# Patient Record
Sex: Male | Born: 2000 | Race: White | Hispanic: No | Marital: Single | Smoking: Never smoker
Health system: Southern US, Community
[De-identification: ages and names within clinical notes are randomized; demographics above are authoritative.]

## PROBLEM LIST (undated history)

## (undated) ENCOUNTER — Ambulatory Visit: Payer: Self-pay

## (undated) DIAGNOSIS — L0591 Pilonidal cyst without abscess: Secondary | ICD-10-CM

## (undated) DIAGNOSIS — K602 Anal fissure, unspecified: Secondary | ICD-10-CM

---
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
## 2022-10-29 ENCOUNTER — Ambulatory Visit
Admission: EM | Admit: 2022-10-29 | Discharge: 2022-10-29 | Disposition: A | Discharge status: Home / Self Care | Payer: Medicaid Other | Attending: Internal Medicine | Admitting: Internal Medicine

## 2022-10-29 ENCOUNTER — Other Ambulatory Visit: Payer: Self-pay

## 2022-10-29 ENCOUNTER — Ambulatory Visit
Admission: RE | Admit: 2022-10-29 | Discharge: 2022-10-29 | Disposition: A | Discharge status: Home / Self Care | Payer: Self-pay | Source: Ambulatory Visit | Attending: Internal Medicine | Admitting: Internal Medicine

## 2022-10-29 ENCOUNTER — Encounter: Payer: Self-pay | Admitting: Emergency Medicine

## 2022-10-29 DIAGNOSIS — R053 Chronic cough: Secondary | ICD-10-CM

## 2022-10-29 DIAGNOSIS — J069 Acute upper respiratory infection, unspecified: Secondary | ICD-10-CM

## 2022-10-29 MED ORDER — ALBUTEROL SULFATE HFA 108 (90 BASE) MCG/ACT IN AERS: 1.0000 | INHALATION_SPRAY | Freq: Four times a day (QID) | RESPIRATORY_TRACT | 0 refills | Status: AC | PRN

## 2022-10-29 MED ORDER — PREDNISONE 20 MG PO TABS: 40.0000 mg | ORAL_TABLET | Freq: Every day | ORAL | 0 refills | Status: AC

## 2022-10-29 MED ORDER — AMOXICILLIN-POT CLAVULANATE 875-125 MG PO TABS: 1.0000 | ORAL_TABLET | Freq: Two times a day (BID) | ORAL | 0 refills | Status: DC

## 2022-10-29 NOTE — Discharge Instructions (Addendum)
I have prescribed an antibiotic, steroid, inhaler to help alleviate your symptoms.  I have ordered an x-ray that will be completed at [facility].  Please go there today to have x-ray completed.  I will call if results are abnormal.  Follow-up with family doctor if symptoms persist or worsen.

## 2022-10-29 NOTE — ED Triage Notes (Signed)
Pt here for cough and congestion still present after being treated for PNA recently

## 2022-10-29 NOTE — ED Provider Notes (Addendum)
[facility]    CSN: [id number] Arrival date & time: 10/29/22  1413      History   Chief Complaint Chief Complaint  Patient presents with   Cough    HPI Paul [name] is a 22 y.o. male.   Patient presents with cough and nasal congestion that has been present for 1 to 2 months.  Patient was seen in a different urgent care on 09/21/2022 and treated for pneumonia with amoxicillin, azithromycin, cough medication.  Patient reports that he completed these medications fully with minimal improvement in symptoms.  No chest x-ray was completed but they treated based on symptoms.  Patient reports that he has also taken Mucinex with minimal improvement.  Cough is dry.  He does have some intermittent shortness of breath that mainly occurs with exertion.  Denies any fever.  Denies chest pain.  Denies history of asthma.  Patient does not smoke cigarettes. Mother is also present who helps provide history.    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   Past Surgical History:  Procedure Laterality Date   ORIF RADIAL FRACTURE Left 09/22/2020   Procedure: OPEN REDUCTION INTERNAL FIXATION (ORIF) LEFT  RADIAL FRACTURE;  Surgeon: Dayna Barker, MD;  Location: [place];  Service: Plastics;  Laterality: Left;  regional block   PILONIDAL CYST EXCISION N/A 07/09/2017   Procedure: EXCISION PILONIDAL CYST PEDIATRIC EXTENSIVE;  Surgeon: Stanford Scotland, MD;  Location: [place];  Service: Pediatrics;  Laterality: N/A;       Home Medications    Prior to Admission medications   Medication Sig Start Date End Date Taking? Authorizing Provider  albuterol (VENTOLIN HFA) 108 (90 Base) MCG/ACT inhaler Inhale 1-2 puffs into the lungs every 6 (six) hours as needed for wheezing or shortness of breath. 10/29/22  Yes Trynity Skousen, Hildred Alamin E, FNP  amoxicillin-clavulanate (AUGMENTIN)  875-125 MG tablet Take 1 tablet by mouth every 12 (twelve) hours. 10/29/22  Yes Amadi Frady, Hildred Alamin E, FNP  predniSONE (DELTASONE) 20 MG tablet Take 2 tablets (40 mg total) by mouth daily for 5 days. 10/29/22 11/03/22 Yes Garhett Bernhard, Michele Rockers, FNP  naproxen (NAPROSYN) 375 MG tablet Take 1 tablet (375 mg total) by mouth 2 (two) times daily. 10/18/20   Wieters, Hallie C, PA-C  ondansetron (ZOFRAN ODT) 4 MG disintegrating tablet Take 1 tablet (4 mg total) by mouth every 8 (eight) hours as needed for nausea or vomiting. 10/18/20   Joneen Caraway, Elesa Hacker, PA-C    Family History Family History  Problem Relation Age of Onset   Cancer Paternal Aunt        Thyroid   Cancer Paternal Grandmother        Bladder   Hearing loss Paternal Grandmother    Depression Maternal Grandmother    Varicose Veins Maternal Grandmother    Hearing loss Maternal Grandfather    Hyperlipidemia Maternal Grandfather    Hypertension Maternal Grandfather    Hearing loss Other        Deaf   Hearing loss Other        Deaf   Hypertension Paternal Grandfather     Social History Social History   Tobacco Use   Smoking status: Passive Smoke Exposure - Never Smoker   Smokeless tobacco: Never  Substance Use Topics   Alcohol use: No   Drug  use: No     Allergies   Patient has no known allergies.   Review of Systems Review of Systems Per HPI  Physical Exam Triage Vital Signs ED Triage Vitals [10/29/22 1423]  Enc Vitals Group     BP 133/85     Pulse Rate 89     Resp 18     Temp 98.2 F (36.8 C)     Temp Source Oral     SpO2 97 %     Weight      Height      Head Circumference      Peak Flow      Pain Score 0     Pain Loc      Pain Edu?      Excl. in [place]?    No data found.  Updated Vital Signs BP 133/85 (BP Location: Left Arm)   Pulse 89   Temp 98.2 F (36.8 C) (Oral)   Resp 18   SpO2 97%   Visual Acuity Right Eye Distance:   Left Eye Distance:   Bilateral Distance:    Right Eye Near:   Left Eye Near:     Bilateral Near:     Physical Exam Constitutional:      General: He is not in acute distress.    Appearance: Normal appearance. He is not toxic-appearing or diaphoretic.  HENT:     Head: Normocephalic and atraumatic.     Right Ear: Tympanic membrane and ear canal normal.     Left Ear: Tympanic membrane and ear canal normal.     Nose: Congestion present.     Mouth/Throat:     Mouth: Mucous membranes are moist.     Pharynx: No posterior oropharyngeal erythema.  Eyes:     Extraocular Movements: Extraocular movements intact.     Conjunctiva/sclera: Conjunctivae normal.     Pupils: Pupils are equal, round, and reactive to light.  Cardiovascular:     Rate and Rhythm: Normal rate and regular rhythm.     Pulses: Normal pulses.     Heart sounds: Normal heart sounds.  Pulmonary:     Effort: Pulmonary effort is normal. No respiratory distress.     Breath sounds: Normal breath sounds. No stridor. No wheezing, rhonchi or rales.  Abdominal:     General: Abdomen is flat. Bowel sounds are normal.     Palpations: Abdomen is soft.  Musculoskeletal:        General: Normal range of motion.     Cervical back: Normal range of motion.  Skin:    General: Skin is warm and dry.  Neurological:     General: No focal deficit present.     Mental Status: He is alert and oriented to person, place, and time. Mental status is at baseline.  Psychiatric:        Mood and Affect: Mood normal.        Behavior: Behavior normal.      UC Treatments / Results  Labs (all labs ordered are listed, but only abnormal results are displayed) Labs Reviewed - No data to display  EKG   Radiology No results found.  Procedures Procedures (including critical care time)  Medications Ordered in UC Medications - No data to display  Initial Impression / Assessment and Plan / UC Course  I have reviewed the triage vital signs and the nursing notes.  Pertinent labs & imaging results that were available during my  care of the patient were reviewed by me  and considered in my medical decision making (see chart for details).     I am concerned for persistent secondary bacterial infection given duration of symptoms.  Will treat with Augmentin antibiotic.  I also think patient would benefit from prednisone steroid therapy to decrease inflammation so this prescribed.  No contraindications to steroid therapy noted in patient's history.  Will also prescribe an inhaler to take as needed for shortness of breath.  I do think that imaging of the chest is necessary as well.  No adventitious lung sounds on exam, fever, no tachypnea so do not think that emergent evaluation is necessary.  Do not have x-ray tech here in urgent care today so outpatient imaging was ordered.  Awaiting result.  Advised strict return precautions.  Advised following up with PCP if symptoms persist. Patient verbalized understanding and was agreeable with plan. Final Clinical Impressions(s) / UC Diagnoses   Final diagnoses:  Acute upper respiratory infection  Persistent cough for 3 weeks or longer     Discharge Instructions      I have prescribed an antibiotic, steroid, inhaler to help alleviate your symptoms.  I have ordered an x-ray that will be completed at [facility].  Please go there today to have x-ray completed.  I will call if results are abnormal.  Follow-up with family doctor if symptoms persist or worsen.    ED Prescriptions     Medication Sig Dispense Auth. Provider   amoxicillin-clavulanate (AUGMENTIN) 875-125 MG tablet Take 1 tablet by mouth every 12 (twelve) hours. 14 tablet [place], [place] E, [place]   predniSONE (DELTASONE) 20 MG tablet Take 2 tablets (40 mg total) by mouth daily for 5 days. 10 tablet [place], Hildred Alamin E, [place]   albuterol (VENTOLIN HFA) 108 (90 Base) MCG/ACT inhaler Inhale 1-2 puffs into the lungs every 6 (six) hours as needed for wheezing or shortness of breath. 1 each Teodora Medici, [place]      PDMP not  reviewed this encounter.   Teodora Medici, [place] 10/29/22 [place], [place], [place] 10/29/22 [phone]

## 2022-11-06 ENCOUNTER — Encounter: Payer: Self-pay | Admitting: Emergency Medicine

## 2022-11-06 ENCOUNTER — Ambulatory Visit
Admission: EM | Admit: 2022-11-06 | Discharge: 2022-11-06 | Disposition: A | Discharge status: Home / Self Care | Payer: Self-pay | Attending: Internal Medicine | Admitting: Internal Medicine

## 2022-11-06 DIAGNOSIS — R0981 Nasal congestion: Secondary | ICD-10-CM

## 2022-11-06 DIAGNOSIS — R053 Chronic cough: Secondary | ICD-10-CM

## 2022-11-06 MED ORDER — CETIRIZINE HCL 10 MG PO TABS: 10.0000 mg | ORAL_TABLET | Freq: Every day | ORAL | 0 refills | Status: DC

## 2022-11-06 MED ORDER — BENZONATATE 100 MG PO CAPS: 100.0000 mg | ORAL_CAPSULE | Freq: Three times a day (TID) | ORAL | 0 refills | Status: DC | PRN

## 2022-11-06 NOTE — Discharge Instructions (Signed)
I have started you on an allergy medication and a cough medication.  Recommend that you continue the albuterol inhaler as needed.  An ambulatory referral to asthma and allergy specialist has been made.  If they do not call you within 48 hours, please call them at provided contact information.

## 2022-11-06 NOTE — ED Provider Notes (Signed)
[facility]    CSN: [id number] Arrival date & time: 11/06/22  1238      History   Chief Complaint Chief Complaint  Patient presents with   Shortness of Breath   Cough   Nasal Congestion    HPI Paul [name] is a 22 y.o. male.   Patient presents with persistent nasal congestion and cough for a few months.  Patient was seen on 1/27 and diagnosed with pneumonia.  Treated with antibiotics with no improvement in symptoms.  He was seen again on 3/5 at this urgent care.  Chest x-ray was completed that was negative for any acute cardiopulmonary process.  He was treated with Augmentin, prednisone, albuterol inhaler.  He reports no improvement in symptoms with these medications except for the inhaler.  He reports that he does have some mild improvement when he uses the inhaler.  Cough is productive at times.  He denies any fevers.  Patient reports that he notices that his symptoms get worse when he goes to work at [employer] and he is around a lot of dust.  Denies any history of asthma.  Denies chest pain, gastrointestinal symptoms, ear pain.   Shortness of Breath Cough   Past Medical History:  Diagnosis Date   Anal fissure    Cyst near tailbone    Polynodal cyst     There are no problems to display for this patient.   Past Surgical History:  Procedure Laterality Date   ORIF RADIAL FRACTURE Left 09/22/2020   Procedure: OPEN REDUCTION INTERNAL FIXATION (ORIF) LEFT  RADIAL FRACTURE;  Surgeon: Dayna Barker, MD;  Location: [place];  Service: Plastics;  Laterality: Left;  regional block   PILONIDAL CYST EXCISION N/A 07/09/2017   Procedure: EXCISION PILONIDAL CYST PEDIATRIC EXTENSIVE;  Surgeon: Stanford Scotland, MD;  Location: [place];  Service: Pediatrics;  Laterality: N/A;       Home Medications    Prior to Admission medications   Medication Sig Start Date End Date Taking? Authorizing Provider  benzonatate (TESSALON) 100 MG capsule Take 1 capsule (100 mg  total) by mouth every 8 (eight) hours as needed for cough. 11/06/22  Yes Elenie Coven, Michele Rockers, FNP  cetirizine (ZYRTEC) 10 MG tablet Take 1 tablet (10 mg total) by mouth daily. 11/06/22  Yes Corde Antonini, Michele Rockers, FNP  albuterol (VENTOLIN HFA) 108 (90 Base) MCG/ACT inhaler Inhale 1-2 puffs into the lungs every 6 (six) hours as needed for wheezing or shortness of breath. 10/29/22   Teodora Medici, FNP  amoxicillin-clavulanate (AUGMENTIN) 875-125 MG tablet Take 1 tablet by mouth every 12 (twelve) hours. 10/29/22   Teodora Medici, FNP  naproxen (NAPROSYN) 375 MG tablet Take 1 tablet (375 mg total) by mouth 2 (two) times daily. 10/18/20   Wieters, Hallie C, PA-C  ondansetron (ZOFRAN ODT) 4 MG disintegrating tablet Take 1 tablet (4 mg total) by mouth every 8 (eight) hours as needed for nausea or vomiting. 10/18/20   Janith Lima, PA-C    Family History Family History  Problem Relation Age of Onset   Cancer Paternal Aunt        Thyroid   Cancer Paternal Grandmother        Bladder   Hearing loss Paternal Grandmother    Depression Maternal Grandmother    Varicose Veins Maternal Grandmother    Hearing loss Maternal Grandfather    Hyperlipidemia Maternal Grandfather    Hypertension Maternal Grandfather    Hearing loss Other  Deaf   Hearing loss Other        Deaf   Hypertension Paternal Grandfather     Social History Social History   Tobacco Use   Smoking status: Passive Smoke Exposure - Never Smoker   Smokeless tobacco: Never  Substance Use Topics   Alcohol use: No   Drug use: No     Allergies   Patient has no known allergies.   Review of Systems Review of Systems Per HPI  Physical Exam Triage Vital Signs ED Triage Vitals  Enc Vitals Group     BP 11/06/22 1313 125/85     Pulse Rate 11/06/22 1313 74     Resp 11/06/22 1313 18     Temp 11/06/22 1313 98.1 F (36.7 C)     Temp Source 11/06/22 1313 Oral     SpO2 11/06/22 1313 98 %     Weight --      Height --      Head  Circumference --      Peak Flow --      Pain Score 11/06/22 1312 0     Pain Loc --      Pain Edu? --      Excl. in [place]? --    No data found.  Updated Vital Signs BP 125/85 (BP Location: Right Arm)   Pulse 74   Temp 98.1 F (36.7 C) (Oral)   Resp 18   SpO2 98%   Visual Acuity Right Eye Distance:   Left Eye Distance:   Bilateral Distance:    Right Eye Near:   Left Eye Near:    Bilateral Near:     Physical Exam Constitutional:      General: He is not in acute distress.    Appearance: Normal appearance. He is not toxic-appearing or diaphoretic.  HENT:     Head: Normocephalic and atraumatic.     Right Ear: Tympanic membrane and ear canal normal.     Left Ear: Tympanic membrane and ear canal normal.     Nose: Congestion present.     Mouth/Throat:     Mouth: Mucous membranes are moist.     Pharynx: No posterior oropharyngeal erythema.  Eyes:     Extraocular Movements: Extraocular movements intact.     Conjunctiva/sclera: Conjunctivae normal.     Pupils: Pupils are equal, round, and reactive to light.  Cardiovascular:     Rate and Rhythm: Normal rate and regular rhythm.     Pulses: Normal pulses.     Heart sounds: Normal heart sounds.  Pulmonary:     Effort: Pulmonary effort is normal. No respiratory distress.     Breath sounds: Normal breath sounds. No stridor. No wheezing, rhonchi or rales.  Abdominal:     General: Abdomen is flat. Bowel sounds are normal.     Palpations: Abdomen is soft.  Musculoskeletal:        General: Normal range of motion.     Cervical back: Normal range of motion.  Skin:    General: Skin is warm and dry.  Neurological:     General: No focal deficit present.     Mental Status: He is alert and oriented to person, place, and time. Mental status is at baseline.  Psychiatric:        Mood and Affect: Mood normal.        Behavior: Behavior normal.      UC Treatments / Results  Labs (all labs ordered are listed, but only abnormal results  are displayed) Labs Reviewed  CBC  COMPREHENSIVE METABOLIC PANEL    EKG   Radiology No results found.  Procedures Procedures (including critical care time)  Medications Ordered in UC Medications - No data to display  Initial Impression / Assessment and Plan / UC Course  I have reviewed the triage vital signs and the nursing notes.  Pertinent labs & imaging results that were available during my care of the patient were reviewed by me and considered in my medical decision making (see chart for details).     Given symptoms have been persistent and symptoms worsen when he goes to his work environment, I am highly suspicious that patient has allergic component to symptoms.  Will treat with daily antihistamine.  Will also continue albuterol inhaler as needed.  Cough medication also prescribed.  Lung sounds are clear so do not think that additional chest imaging is necessary especially given that previous chest imaging a few days prior was normal.  I do think it will be beneficial for patient to see allergy and asthma specialist for further evaluation.  Ambulatory referral was made for patient.  Also provided patient with contact information for asthma and allergy specialist if they do not call him in the next 48 hours.  Will obtain CBC and CMP to rule any other worrisome etiologies.  Advised strict return precautions.  Patient verbalized understanding and was agreeable with plan. Final Clinical Impressions(s) / UC Diagnoses   Final diagnoses:  Nasal congestion  Persistent cough for 3 weeks or longer     Discharge Instructions      I have started you on an allergy medication and a cough medication.  Recommend that you continue the albuterol inhaler as needed.  An ambulatory referral to asthma and allergy specialist has been made.  If they do not call you within 48 hours, please call them at provided contact information.    ED Prescriptions     Medication Sig Dispense Auth.  Provider   cetirizine (ZYRTEC) 10 MG tablet Take 1 tablet (10 mg total) by mouth daily. 30 tablet [place], [place] E, [place]   benzonatate (TESSALON) 100 MG capsule Take 1 capsule (100 mg total) by mouth every 8 (eight) hours as needed for cough. 21 capsule [place], Michele Rockers, [place]      PDMP not reviewed this encounter.   Teodora Medici,  11/06/22 1346

## 2022-11-06 NOTE — ED Triage Notes (Signed)
Pt became lightheaded and clammy while drawing blood. Pt states does not want to proceed with blood work at this time. Given water and vitals taken. States would like to go eat lunch and then come back later in the day to reattempt blood work. Provider made aware.

## 2022-11-06 NOTE — ED Triage Notes (Signed)
Pt presents with reoccurring cough. States receives relief with inhaler.

## 2022-11-08 LAB — CBC
Hematocrit: 48.1 % (ref 37.5–51.0)
Hemoglobin: 15.3 g/dL (ref 13.0–17.7)
MCH: 28.4 pg (ref 26.6–33.0)
MCHC: 31.8 g/dL (ref 31.5–35.7)
MCV: 89 fL (ref 79–97)
Platelets: 279 10*3/uL (ref 150–450)
RBC: 5.38 x10E6/uL (ref 4.14–5.80)
RDW: 13 % (ref 11.6–15.4)
WBC: 8.6 10*3/uL (ref 3.4–10.8)

## 2022-11-08 LAB — COMPREHENSIVE METABOLIC PANEL
ALT: 15 IU/L (ref 0–44)
AST: 16 IU/L (ref 0–40)
Albumin/Globulin Ratio: 2.3 — ABNORMAL HIGH (ref 1.2–2.2)
Albumin: 4.8 g/dL (ref 4.3–5.2)
Alkaline Phosphatase: 107 IU/L (ref 51–125)
BUN/Creatinine Ratio: 15 (ref 9–20)
BUN: 14 mg/dL (ref 6–20)
Bilirubin Total: 0.4 mg/dL (ref 0.0–1.2)
CO2: 23 mmol/L (ref 20–29)
Calcium: 9.7 mg/dL (ref 8.7–10.2)
Chloride: 102 mmol/L (ref 96–106)
Creatinine, Ser: 0.91 mg/dL (ref 0.76–1.27)
Globulin, Total: 2.1 g/dL (ref 1.5–4.5)
Glucose: 88 mg/dL (ref 70–99)
Potassium: 5.1 mmol/L (ref 3.5–5.2)
Sodium: 142 mmol/L (ref 134–144)
Total Protein: 6.9 g/dL (ref 6.0–8.5)
eGFR: 125 mL/min/{1.73_m2} (ref >59)

## 2022-12-30 ENCOUNTER — Ambulatory Visit (HOSPITAL_COMMUNITY): Payer: Self-pay | Admitting: Student in an Organized Health Care Education/Training Program

## 2023-01-27 ENCOUNTER — Ambulatory Visit (INDEPENDENT_AMBULATORY_CARE_PROVIDER_SITE_OTHER): Payer: No Payment, Other | Admitting: Student in an Organized Health Care Education/Training Program

## 2023-01-27 ENCOUNTER — Encounter (HOSPITAL_COMMUNITY): Payer: Self-pay | Admitting: Student in an Organized Health Care Education/Training Program

## 2023-01-27 VITALS — BP 127/71 | HR 55 | Ht 71.0 in | Wt 195.2 lb

## 2023-01-27 DIAGNOSIS — F4310 Post-traumatic stress disorder, unspecified: Secondary | ICD-10-CM

## 2023-01-27 DIAGNOSIS — F331 Major depressive disorder, recurrent, moderate: Secondary | ICD-10-CM

## 2023-01-27 DIAGNOSIS — F411 Generalized anxiety disorder: Secondary | ICD-10-CM

## 2023-01-27 MED ORDER — SERTRALINE HCL 25 MG PO TABS: 25.0000 mg | ORAL_TABLET | Freq: Every day | ORAL | 1 refills | Status: DC

## 2023-01-27 NOTE — Progress Notes (Signed)
Psychiatric Initial Adult Assessment   Patient Identification: Paul [name] MRN:  [id number] Date of Evaluation:  01/27/2023 Referral Source: Family Chief Complaint:   Chief Complaint  Patient presents with   Establish Care   Depression   Anxiety   Trauma   Visit Diagnosis:    ICD-10-CM   1. PTSD (post-traumatic stress disorder)  F43.10 sertraline (ZOLOFT) 25 MG tablet    2. MDD (major depressive disorder), recurrent episode, moderate (HCC)  F33.1 sertraline (ZOLOFT) 25 MG tablet    3. GAD (generalized anxiety disorder)  F41.1 sertraline (ZOLOFT) 25 MG tablet      History of Present Illness:   Paul [name] is a 22 yr old male who presents to Establish Care and for Medication Management and Therapy.  PPHx is significant for Depression, Anxiety, and PTSD, and 1 Episode of Self Injurious Behavior (cut arm 2020), and no history of Suicide Attempts or Psychiatric Hospitalizations.  Due to provider seen other family members of the patient discussed with him that anything said would stay between patient and provider.  Discussed that anything said in this session would not be discussed with family members.  Discussed that anything said with family members in their appointments would not be discussed with him.  He reported understanding and was agreeable to continue.  He reports that he presents today due to the urging of his family.  He reports that there is been significant family stress in the form of his parents fighting a lot.  He reports that on top of that his dog recently passed away about 2 months ago which further added distress.  He reports he had been putting it off but that there was an incident where he snapped at his younger sister over something trivial and that made him realize how significantly he was being affected.  He reports that when there are loud sounds he will immediately go into "fight or flight mode."  He reports his heart will start racing and he will start  sweating and he knows that this is not normal.  He reports no formal psychiatric diagnosis in the past.  He reports no history of suicide attempts.  He does report 1 episode of self-injurious behavior where he cut his arm with a piece of glass in 2020 but has never done that since.  He reports no history of psychiatric hospitalizations.  He reports a past medical history significant for tachycardia.  He reports past surgical history significant for pilonidal cystectomy.  He does report a history of a few concussions from playing soccer the first 1 being in middle school.  He reports no history of seizures.  He reports NKDA.  He currently lives with his parents, younger sister, and friend [name].  He currently works as an Visual merchandiser.  He reports he graduated high school.  He reports no alcohol use.  He reports no tobacco use.  He does report occasional THC use-a couple hits from a pen every night.  He reports that there are firearms in the house but that ammo is stored separately.  He reports no current legal issues.  Discussed with him that all of his symptoms do sound like depression and anxiety but that the driving force did seem to be PTSD.  Discussed with him the first line treatment is Zoloft.  Discussed potential risks and side effects including black box warning that given his age group there is a risk of increased SI and that if this happens he needs to  be immediately evaluated.  He reported understanding and was agreeable to a trial.  He reports he also wants to start therapy and discussed that we could do this and have him scheduled.  Discussed with him what to do in the event of a future crisis.  Discussed that he can go to [facility], go to the nearest ED, or call 911 or [phone].   He reported understanding and had no concerns.  He reports no SI, HI, or AVH.  He reports his sleep is fair.  He reports his appetite is fair.  He does report having tenesmus for the last month or 2.  When he reported not having  a PCP he was provided with a list of [place] community physicians so he can make an appointment.  He reports no other concerns at present.  He will return for follow-up in approximately 4 to 6 weeks.   Associated Signs/Symptoms: Depression Symptoms:  depressed mood, anhedonia, fatigue, feelings of worthlessness/guilt, anxiety, loss of energy/fatigue, disturbed sleep, (Hypo) Manic Symptoms:   Reports None Anxiety Symptoms:  Excessive Worry, Panic Symptoms, Psychotic Symptoms:   Reports None PTSD Symptoms: Re-experiencing:  Intrusive Thoughts Nightmares Hypervigilance:  Yes Hyperarousal:  Emotional Numbness/Detachment Increased Startle Response Irritability/Anger Avoidance:  Decreased Interest/Participation  Past Psychiatric History: Depression, Anxiety, and PTSD, and 1 Episode of Self Injurious Behavior (cut arm 2020), and no history of Suicide Attempts or Psychiatric Hospitalizations.  Previous Psychotropic Medications: Yes   Substance Abuse History in the last 12 months:  No.  Consequences of Substance Abuse: NA  Past Medical History:  Past Medical History:  Diagnosis Date   Anal fissure    Cyst near tailbone    Polynodal cyst     Past Surgical History:  Procedure Laterality Date   ORIF RADIAL FRACTURE Left 09/22/2020   Procedure: OPEN REDUCTION INTERNAL FIXATION (ORIF) LEFT  RADIAL FRACTURE;  Surgeon: Knute Neu, MD;  Location: [facility];  Service: Plastics;  Laterality: Left;  regional block   PILONIDAL CYST EXCISION N/A 07/09/2017   Procedure: EXCISION PILONIDAL CYST PEDIATRIC EXTENSIVE;  Surgeon: Kandice Hams, MD;  Location: [facility] OR;  Service: Pediatrics;  Laterality: N/A;    Family Psychiatric History: Father- Depression, Anxiety, ADHD, Suicide Attempts Mother- Depression, Anxiety, ADHD No Known Substance Abuse  Family History:  Family History  Problem Relation Age of Onset   Cancer Paternal Aunt        Thyroid   Cancer Paternal  Grandmother        Bladder   Hearing loss Paternal Grandmother    Depression Maternal Grandmother    Varicose Veins Maternal Grandmother    Hearing loss Maternal Grandfather    Hyperlipidemia Maternal Grandfather    Hypertension Maternal Grandfather    Hearing loss Other        Deaf   Hearing loss Other        Deaf   Hypertension Paternal Grandfather     Social History:   Social History   Socioeconomic History   Marital status: Single    Spouse name: Not on file   Number of children: Not on file   Years of education: Not on file   Highest education level: Not on file  Occupational History   Not on file  Tobacco Use   Smoking status: Never    Passive exposure: Yes   Smokeless tobacco: Never  Substance and Sexual Activity   Alcohol use: No   Drug use: No   Sexual activity: Not  on file  Other Topics Concern   Not on file  Social History Narrative   ** Merged History Encounter **       Rising 10th grade at [employer].    Social Determinants of Health   Financial Resource Strain: Not on file  Food Insecurity: Not on file  Transportation Needs: Not on file  Physical Activity: Not on file  Stress: Not on file  Social Connections: Not on file    Additional Social History: None  Allergies:  No Known Allergies  Metabolic Disorder Labs: No results found for: "HGBA1C", "MPG" No results found for: "PROLACTIN" No results found for: "CHOL", "TRIG", "HDL", "CHOLHDL", "VLDL", "LDLCALC" No results found for: "TSH"  Therapeutic Level Labs: No results found for: "LITHIUM" No results found for: "CBMZ" No results found for: "VALPROATE"  Current Medications: Current Outpatient Medications  Medication Sig Dispense Refill   sertraline (ZOLOFT) 25 MG tablet Take 1 tablet (25 mg total) by mouth daily. 30 tablet 1   albuterol (VENTOLIN HFA) 108 (90 Base) MCG/ACT inhaler Inhale 1-2 puffs into the lungs every 6 (six) hours as needed for wheezing or shortness of breath. 1  each 0   No current facility-administered medications for this visit.    Musculoskeletal: Strength & Muscle Tone: within normal limits Gait & Station: normal Patient leans: N/A  Psychiatric Specialty Exam: Review of Systems  Respiratory:  Negative for cough and shortness of breath.   Cardiovascular:  Negative for chest pain.  Gastrointestinal:  Negative for abdominal pain, constipation, diarrhea, nausea and vomiting.  Neurological:  Negative for weakness and headaches.  Psychiatric/Behavioral:  Positive for dysphoric mood. Negative for hallucinations, sleep disturbance and suicidal ideas. The patient is nervous/anxious.     Blood pressure 127/71, pulse (!) 55, height 5\' 11"  (1.803 m), weight 195 lb 3.2 oz (88.5 kg), SpO2 100 %.Body mass index is 27.22 kg/m.  General Appearance: Casual and Fairly Groomed  Eye Contact:  Good  Speech:  Clear and Coherent and Normal Rate  Volume:  Normal  Mood:  Anxious and Dysphoric  Affect:  Congruent, Depressed, and Tearful  Thought Process:  Coherent and Goal Directed  Orientation:  Full (Time, Place, and Person)  Thought Content:  WDL and Logical  Suicidal Thoughts:  No  Homicidal Thoughts:  No  Memory:  Immediate;   Good Recent;   Good  Judgement:  Good  Insight:  Good  Psychomotor Activity:  Normal  Concentration:  Concentration: Good and Attention Span: Good  Recall:  Good  Fund of Knowledge:Good  Language: Good  Akathisia:  Negative  Handed:  Right  AIMS (if indicated):  not done  Assets:  Communication Skills Desire for Improvement Housing Physical Health Resilience  ADL's:  Intact  Cognition: WNL  Sleep:  Fair   Screenings: GAD-7    Flowsheet Row Office Visit from 01/27/2023 in [facility]  Total GAD-7 Score 17      PHQ2-9    Flowsheet Row Office Visit from 01/27/2023 in [place]  PHQ-2 Total Score 2  PHQ-9 Total Score 16      Flowsheet Row ED from  11/06/2022 in [facility]) ED from 10/29/2022 in [facility] ED from 10[facility]18/2020 in [facility] at [facility])  C-SSRS RISK CATEGORY No Risk No Risk No Risk       Assessment and Plan:  Izael Gramza. Botbyl is a  22 yr old male who presents to Establish Care and for Medication Management and Therapy.  PPHx is significant for Depression, Anxiety, and PTSD, and 1 Episode of Self Injurious Behavior (cut arm 2020), and no history of Suicide Attempts or Psychiatric Hospitalizations.   Govani is having significant impact due to depression and anxiety which does seem to be due to PTSD.  To address this we will start Zoloft.  He is also interested in starting therapy so we will have him scheduled to begin that.  We will not make any other changes to his medication at this time.  He was provided with a list of [place] community clinics to establish with a PCP.  He will return for follow-up in approximately 4-6 weeks.   MDD, Recurrent, Moderate  GAD  PTSD: -Start Zoloft 25 mg daily for depression and anxiety.  30 tablets with 1 refill. -Will start therapy   Collaboration of Care:   Patient/Guardian was advised Release of Information must be obtained prior to any record release in order to collaborate their care with an outside provider. Patient/Guardian was advised if they have not already done so to contact the registration department to sign all necessary forms in order for [place] to release information regarding their care.   Consent: Patient/Guardian gives verbal consent for treatment and assignment of benefits for services provided during this visit. Patient/Guardian expressed understanding and agreed to proceed.   Lauro Franklin, MD 6/3/202411:52 AM

## 2023-02-28 ENCOUNTER — Ambulatory Visit (INDEPENDENT_AMBULATORY_CARE_PROVIDER_SITE_OTHER): Payer: No Payment, Other | Admitting: Student in an Organized Health Care Education/Training Program

## 2023-02-28 ENCOUNTER — Encounter (HOSPITAL_COMMUNITY): Payer: Self-pay | Admitting: Student in an Organized Health Care Education/Training Program

## 2023-02-28 DIAGNOSIS — F411 Generalized anxiety disorder: Secondary | ICD-10-CM

## 2023-02-28 DIAGNOSIS — F4310 Post-traumatic stress disorder, unspecified: Secondary | ICD-10-CM

## 2023-02-28 DIAGNOSIS — F331 Major depressive disorder, recurrent, moderate: Secondary | ICD-10-CM | POA: Diagnosis not present

## 2023-02-28 NOTE — Progress Notes (Signed)
[facility] [address] [place] [address] Dept: [phone] Dept Fax: [phone]  Psychotherapy Progress Note  Patient ID: Paul [name], male  DOB: Jul 13, 2001, 22 y.o.  MRN: [id number]  02/28/2023 Start time: 11:02 AM End time: 11:50 AM  Method of Visit: Face-to-Face  Present: patient  Current Concerns:  He reports one current stressor of his is that his cars broken down.  He reports that due to this he has been staying with his sister and brother-in-law.  He reports that it has been going okay.  He reports that he is doing this since his brother-in-law is the 1 training him for his [employer] job and so they can ride together to work.  He reports that he enjoys the job and gets along well enough with his brother-in-law.  He reports that he has noticed in himself that when Apolinar Junes gets upset he will begin asking rhetorical questions and has a certain tone in his voice.  He reports that this tone reminds him of his father and this causes him to shut down.  He reports another stressor is physical health.  He reports that he has a history of Pilonidal Cysts and needing surgery.  He reports that he appears to have a pilonidal cyst again and has made an appointment with the [facility].  He reports that if he has to have surgery again then that would also put him out of work for a few months.  Discussed journaling to help with processing.  Prior to leaving the appointment he confirmed he was in a stable and safe mindset.  He reports no SI, HI, or AVH.   Current Symptoms: Anxiety, Depressed Mood, and Family Stress  Psychiatric Specialty Exam: General Appearance: Casual and Fairly Groomed  Eye Contact:  Good  Speech:  Clear and Coherent and Normal Rate  Volume:  Normal  Mood:  Dysphoric  Affect:  Congruent  Thought Process:  Coherent and Goal Directed  Orientation:  Full (Time, Place, and Person)  Thought Content:  WDL and  Logical  Suicidal Thoughts:  No  Homicidal Thoughts:  No  Memory:  Immediate;   Good Recent;   Good  Judgement:  Good  Insight:  Good  Psychomotor Activity:  Normal  Concentration:  Concentration: Good and Attention Span: Good  Recall:  Good  Fund of Knowledge:Good  Language: Good  Akathisia:  Negative  Handed:  Right  AIMS (if indicated):  not done  Assets:  Communication Skills Desire for Improvement Housing Physical Health Resilience  ADL's:  Intact  Cognition: WNL  Sleep:  Fair     Diagnosis: MDD, Recurrent, Moderate  GAD  PTSD  Anticipated Frequency of Visits: every 2-3 weeks Anticipated Length of Treatment Episode: 16  Short Term Goals/Goals for Treatment Session: begin journaling  Progress Towards Goals: Progressing  Treatment Intervention: Insight-oriented therapy and Supportive therapy  Medical Necessity: Assisted patient to achieve or maintain maximum functional capacity  Assessment Tools:    01/27/2023   11:51 AM  Depression screen PHQ 2/9  Decreased Interest 1  Down, Depressed, Hopeless 1  PHQ - 2 Score 2  Altered sleeping 2  Tired, decreased energy 2  Change in appetite 2  Feeling bad or failure about yourself  3  Trouble concentrating 2  Moving slowly or fidgety/restless 3  Suicidal thoughts 0  PHQ-9 Score 16  Difficult doing work/chores Very difficult   Failed to redirect to the Timeline version of the REVFS SmartLink.  Flowsheet Row ED from 11/06/2022 in [facility] at [facility]) ED from 10/29/2022 in [facility] at [facility]) ED from 10/18/2020 in [facility] at [facility])  C-SSRS RISK CATEGORY No Risk No Risk No Risk       Collaboration of Care:   Patient/Guardian was advised Release of Information must be obtained prior to any record release in order to collaborate their care with an outside provider. Patient/Guardian was advised if they have not  already done so to contact the registration department to sign all necessary forms in order for [place] to release information regarding their care.   Consent: Patient/Guardian gives verbal consent for treatment and assignment of benefits for services provided during this visit. Patient/Guardian expressed understanding and agreed to proceed.   Plan: Provided Supportive/Talk therapy.  He will begin journaling to help him process his thoughts/feelings.  Prior to leaving the appointment he confirmed he was in a stable and safe mindset.  He reports no SI, HI, or AVH.   Lauro Franklin, MD 02/28/2023

## 2023-03-27 ENCOUNTER — Emergency Department (HOSPITAL_COMMUNITY)
Admission: EM | Admit: 2023-03-27 | Discharge: 2023-03-27 | Disposition: A | Discharge status: Home / Self Care | Payer: Self-pay | Attending: Emergency Medicine | Admitting: Emergency Medicine

## 2023-03-27 ENCOUNTER — Encounter (HOSPITAL_COMMUNITY): Payer: Self-pay

## 2023-03-27 ENCOUNTER — Other Ambulatory Visit: Payer: Self-pay

## 2023-03-27 ENCOUNTER — Emergency Department (HOSPITAL_COMMUNITY): Payer: Self-pay

## 2023-03-27 DIAGNOSIS — R079 Chest pain, unspecified: Secondary | ICD-10-CM

## 2023-03-27 DIAGNOSIS — R0789 Other chest pain: Secondary | ICD-10-CM | POA: Insufficient documentation

## 2023-03-27 DIAGNOSIS — R55 Syncope and collapse: Secondary | ICD-10-CM | POA: Insufficient documentation

## 2023-03-27 LAB — URINALYSIS, ROUTINE W REFLEX MICROSCOPIC
Bilirubin Urine: NEGATIVE
Glucose, UA: NEGATIVE mg/dL
Hgb urine dipstick: NEGATIVE
Ketones, ur: NEGATIVE mg/dL
Leukocytes,Ua: NEGATIVE
Nitrite: NEGATIVE
Protein, ur: NEGATIVE mg/dL
Specific Gravity, Urine: 1.023 (ref 1.005–1.030)
pH: 7 (ref 5.0–8.0)

## 2023-03-27 LAB — TROPONIN I (HIGH SENSITIVITY)
Troponin I (High Sensitivity): <2 ng/L (ref <18)
Troponin I (High Sensitivity): 2 ng/L (ref <18)

## 2023-03-27 LAB — BASIC METABOLIC PANEL
Anion gap: 9 (ref 5–15)
BUN: 13 mg/dL (ref 6–20)
CO2: 21 mmol/L — ABNORMAL LOW (ref 22–32)
Calcium: 9.3 mg/dL (ref 8.9–10.3)
Chloride: 106 mmol/L (ref 98–111)
Creatinine, Ser: 0.77 mg/dL (ref 0.61–1.24)
GFR, Estimated: >60 mL/min (ref >60)
Glucose, Bld: 96 mg/dL (ref 70–99)
Potassium: 3.5 mmol/L (ref 3.5–5.1)
Sodium: 136 mmol/L (ref 135–145)

## 2023-03-27 LAB — CBC
HCT: 44.2 % (ref 39.0–52.0)
Hemoglobin: 15 g/dL (ref 13.0–17.0)
MCH: 29.5 pg (ref 26.0–34.0)
MCHC: 33.9 g/dL (ref 30.0–36.0)
MCV: 87 fL (ref 80.0–100.0)
Platelets: 228 10*3/uL (ref 150–400)
RBC: 5.08 MIL/uL (ref 4.22–5.81)
RDW: 12.1 % (ref 11.5–15.5)
WBC: 5.7 10*3/uL (ref 4.0–10.5)
nRBC: 0 % (ref 0.0–0.2)

## 2023-03-27 LAB — CBG MONITORING, ED: Glucose-Capillary: 89 mg/dL (ref 70–99)

## 2023-03-27 MED ORDER — SODIUM CHLORIDE 0.9 % IV BOLUS
500.0000 mL | Freq: Once | INTRAVENOUS | Status: AC
  Administered 2023-03-27: 500 mL via INTRAVENOUS

## 2023-03-27 MED ORDER — KETOROLAC TROMETHAMINE 30 MG/ML IJ SOLN
30.0000 mg | Freq: Once | INTRAMUSCULAR | Status: AC
Start: 2023-03-27 — End: 2023-03-27
  Administered 2023-03-27: 30 mg via INTRAVENOUS
  Filled 2023-03-27: qty 1

## 2023-03-27 MED ORDER — IBUPROFEN 800 MG PO TABS
800.0000 mg | ORAL_TABLET | Freq: Three times a day (TID) | ORAL | 0 refills | Status: AC
  Filled 2023-03-27: qty 21, 7d supply, fill #0

## 2023-03-27 NOTE — ED Triage Notes (Signed)
Pt arrives via POV. Pt reports waking up this morning with chest pain. States when he was at work he had 2 episodes of dizziness, worsening chest pain and some tingling in all extremities. Pt AxOx4.

## 2023-03-27 NOTE — ED Provider Notes (Signed)
[place] EMERGENCY DEPARTMENT AT [facility] Provider Note   CSN: [id number] Arrival date & time: 03/27/23  1021     History  Chief Complaint  Patient presents with   Chest Pain   Near Syncope    Paul [name] is a 22 y.o. male.  HPI   22 year old male with past medical history of anxiety, PTSD presents emergency department with left lower chest pain.  Patient states he woke up this morning at his usual time around 630 with left lower chest pain.  He states that it is worse when he stands up and stretches, sometimes worse with specific movements of the left upper extremity.  He went to work.  While he was there he had 2 episodes of dizziness and some tingling in all 4 extremities/lips.  He said that his coworker said that he looked unwell so he came in for evaluation.  No previous cardiac history.  Never had any shortness of breath or syncope.  No DVT/PE risk factors.  No cough or swelling of his lower legs.  No recent infection or fever.  Pain does not worsen when lying flat.  Home Medications Prior to Admission medications   Medication Sig Start Date End Date Taking? Authorizing Provider  albuterol (VENTOLIN HFA) 108 (90 Base) MCG/ACT inhaler Inhale 1-2 puffs into the lungs every 6 (six) hours as needed for wheezing or shortness of breath. 10/29/22   Gustavus Bryant, FNP  sertraline (ZOLOFT) 25 MG tablet Take 1 tablet (25 mg total) by mouth daily. 01/27/23   Lauro Franklin, MD      Allergies    Patient has no known allergies.    Review of Systems   Review of Systems  Constitutional:  Negative for fever.  Respiratory:  Negative for chest tightness and shortness of breath.   Cardiovascular:  Positive for chest pain. Negative for palpitations and leg swelling.  Gastrointestinal:  Negative for abdominal pain, diarrhea and vomiting.  Genitourinary:  Negative for flank pain.  Musculoskeletal:  Negative for back pain.  Skin:  Negative for rash.  Neurological:   Negative for headaches.    Physical Exam Updated Vital Signs BP 115/81   Pulse (!) 58   Temp 97.8 F (36.6 C)   Resp 14   Ht 5\' 11"  (1.803 m)   Wt 88.5 kg   SpO2 100%   BMI 27.20 kg/m  Physical Exam Vitals and nursing note reviewed.  Constitutional:      General: He is not in acute distress.    Appearance: Normal appearance. He is not ill-appearing or diaphoretic.  HENT:     Head: Normocephalic.     Mouth/Throat:     Mouth: Mucous membranes are moist.  Cardiovascular:     Rate and Rhythm: Normal rate.  Pulmonary:     Effort: Pulmonary effort is normal. No respiratory distress.  Chest:     Comments: Mild reproducible tenderness at the left lower sternal border, no overlying skin changes. No crepitus Abdominal:     Palpations: Abdomen is soft.     Tenderness: There is no abdominal tenderness.  Musculoskeletal:     Right lower leg: No edema.     Left lower leg: No edema.  Skin:    General: Skin is warm.  Neurological:     Mental Status: He is alert and oriented to person, place, and time. Mental status is at baseline.  Psychiatric:        Mood and Affect: Mood normal.  ED Results / Procedures / Treatments   Labs (all labs ordered are listed, but only abnormal results are displayed) Labs Reviewed  BASIC METABOLIC PANEL - Abnormal; Notable for the following components:      Result Value   CO2 21 (*)    All other components within normal limits  CBC  URINALYSIS, ROUTINE W REFLEX MICROSCOPIC  CBG MONITORING, ED  TROPONIN I (HIGH SENSITIVITY)  TROPONIN I (HIGH SENSITIVITY)    EKG EKG Interpretation Date/Time:  Thursday March 27 2023 10:31:07 EDT Ventricular Rate:  65 PR Interval:  132 QRS Duration:  109 QT Interval:  401 QTC Calculation: 417 R Axis:   1  Text Interpretation: Sinus rhythm RSR' in V1 or V2, right VCD or RVH ST elev, probable normal early repol pattern Confirmed by Coralee Pesa [phone]) on 03/27/2023 11:12:01 AM  Radiology DG Chest  Port 1 View  Result Date: 03/27/2023 CLINICAL DATA:  October 29, 2022 EXAM: PORTABLE CHEST 1 VIEW COMPARISON:  None Available. FINDINGS: The cardiomediastinal silhouette is unchanged in contour. No pleural effusion. No pneumothorax. No acute pleuroparenchymal abnormality. IMPRESSION: No acute cardiopulmonary abnormality. Electronically Signed   By: Meda Klinefelter M.D.   On: 03/27/2023 11:35    Procedures Procedures    Medications Ordered in ED Medications  sodium chloride 0.9 % bolus 500 mL (500 mLs Intravenous New Bag/Given 03/27/23 1203)  ketorolac (TORADOL) 30 MG/ML injection 30 mg (30 mg Intravenous Given 03/27/23 1203)    ED Course/ Medical Decision Making/ A&P                                 Medical Decision Making Amount and/or Complexity of Data Reviewed Labs: ordered. Radiology: ordered.  Risk Prescription drug management.   22 year old male presents emergency department with left lower chest pain associated with 2 resolved episodes of dizziness.  No syncope.  No cardiac history.  No current DVT/PE risk factors.  EKG shows findings of early repolarization.  Chest pain is left lower sternum, moderately reproducible with palpation and movements of the left upper extremity.  Patient has no cough, hemoptysis or leg swelling.  He is PERC negative, doubt PE.  Pain pattern not consistent with dissection.  Blood work is reassuring, troponins are negative, doubt ACS.  After dose of Toradol the pain has improved.  Continues to be reproducible.  Believe this is most likely musculoskeletal.  In regards to the tingling of all 4 extremity/perioral area this seems consistent with anxiety associated with the chest pain.  Patient feels back to baseline.  Will recommend ibuprofen for continued relief with close outpatient follow-up.  Patient and family at bedside agree.  Patient at this time appears safe and stable for discharge and close outpatient follow up. Discharge plan and strict return  to ED precautions discussed, patient verbalizes understanding and agreement.        Final Clinical Impression(s) / ED Diagnoses Final diagnoses:  None    Rx / DC Orders ED Discharge Orders     None         Rozelle Logan, DO 03/28/23 9629

## 2023-03-27 NOTE — Discharge Instructions (Addendum)
You have been seen and discharged from the emergency department.  Your heart and lung workup were normal.  I suspect that your chest pain is musculoskeletal.  Take ibuprofen as prescribed.  Stay well-hydrated.  You may establish care and follow-up with cardiology if you continue to have chest discomfort.  Follow-up with your primary provider for further evaluation and further care. Take home medications as prescribed. If you have any worsening symptoms or further concerns for your health please return to an emergency department for further evaluation.

## 2023-03-28 ENCOUNTER — Other Ambulatory Visit: Payer: Self-pay

## 2023-04-18 ENCOUNTER — Encounter (HOSPITAL_COMMUNITY): Payer: Self-pay | Admitting: Student in an Organized Health Care Education/Training Program

## 2023-04-18 ENCOUNTER — Other Ambulatory Visit: Payer: Self-pay

## 2023-04-18 ENCOUNTER — Ambulatory Visit (INDEPENDENT_AMBULATORY_CARE_PROVIDER_SITE_OTHER): Payer: No Payment, Other | Admitting: Student in an Organized Health Care Education/Training Program

## 2023-04-18 DIAGNOSIS — F4310 Post-traumatic stress disorder, unspecified: Secondary | ICD-10-CM | POA: Diagnosis not present

## 2023-04-18 DIAGNOSIS — F331 Major depressive disorder, recurrent, moderate: Secondary | ICD-10-CM | POA: Diagnosis not present

## 2023-04-18 DIAGNOSIS — F411 Generalized anxiety disorder: Secondary | ICD-10-CM

## 2023-04-18 MED ORDER — SERTRALINE HCL 50 MG PO TABS
50.0000 mg | ORAL_TABLET | Freq: Every day | ORAL | 1 refills | Status: AC
  Filled 2023-04-18: qty 30, 30d supply, fill #0

## 2023-04-18 NOTE — Progress Notes (Signed)
[facility] [address] [place] [address] Dept: [phone] Dept Fax: [phone]  Psychotherapy Progress Note  Patient ID: Paul [name], male  DOB: 30-May-2001, 22 y.o.  MRN: [id number]  04/18/2023 Start time: 11:02 AM End time: 11:50 AM  Method of Visit: Face-to-Face  Present: patient  Current Concerns:  He reports significant stress due to family's financial situation.  He reports that his parents are having some difficulties and so he has been helping to pay some of the bills and buy food.  He reports that he is stressed that he is not able to help more.  He also reports being frustrated with coming home after working all day and seeing his parents watching TV.  He reports he knows that they were working during the day but this gets to him because it seems like they are not working.  He reports his mother is in charge of sending out invoices and she reports knowing she needs to be better about doing it on time but knowing about a problem and doing things to fix it are 2 different things.  He reports that on the first of the month he had an incident while at work.  He reports he started having chest pain and became very sweaty.  He reports he had a racing heart rate and even had some vision changes as he was driving home from work.  He reports he went to the emergency room and was told it was musculoskeletal pain.  He reports this was very stressful in his own right.  Discussed things that he is proud of himself for.  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  He recounts a recent event that he did at a convention.  He reports since he has been doing it since he was about 18 other people are coming to him for help and he took pride in that.  Encouraged him to continue thinking on his strengths and what he takes pride in for next time so that we could discuss.  Discussed further  increasing the Zoloft as he tolerated starting it.  He was agreeable to this and had no concerns.  Prior to leaving the appointment he confirmed he was in a stable and safe mindset.  He reports no SI, HI, or AVH.    Current Symptoms: Anxiety, Depressed Mood, and Family Stress, financial stress  Psychiatric Specialty Exam: General Appearance: Casual and Fairly Groomed  Eye Contact:  Good  Speech:  Clear and Coherent and Normal Rate  Volume:  Normal  Mood:  Anxious  Affect:  Congruent  Thought Process:  Coherent and Goal Directed  Orientation:  Full (Time, Place, and Person)  Thought Content:  WDL and Logical  Suicidal Thoughts:  No  Homicidal Thoughts:  No  Memory:  Immediate;   Good Recent;   Good  Judgement:  Good  Insight:  Good  Psychomotor Activity:  Normal  Concentration:  Concentration: Good and Attention Span: Good  Recall:  Good  Fund of Knowledge:Good  Language: Good  Akathisia:  Negative  Handed:  Right  AIMS (if indicated):  not done  Assets:  Communication Skills Desire for Improvement Housing Physical Health Resilience  ADL's:  Intact  Cognition: WNL  Sleep:  Fair     Diagnosis: MDD, Recurrent, Moderate  GAD  PTSD  Anticipated Frequency of Visits: every 2-3 weeks Anticipated Length of Treatment Episode: 16  Short  Term Goals/Goals for Treatment Session: reflect on strengths Progress Towards Goals: Progressing  Treatment Intervention: Insight-oriented therapy and Supportive therapy  Medical Necessity: Assisted patient to achieve or maintain maximum functional capacity  Assessment Tools:    01/27/2023   11:51 AM  Depression screen PHQ 2/9  Decreased Interest 1  Down, Depressed, Hopeless 1  PHQ - 2 Score 2  Altered sleeping 2  Tired, decreased energy 2  Change in appetite 2  Feeling bad or failure about yourself  3  Trouble concentrating 2  Moving slowly or fidgety/restless 3  Suicidal thoughts 0  PHQ-9 Score 16  Difficult doing  work/chores Very difficult   Failed to redirect to the Timeline version of the REVFS SmartLink. Flowsheet Row ED from 03/27/2023 in [facility] Emergency Department at [facility] ED from 11/06/2022 in [facility]gent Care at [facility]) ED from 10/29/2022 in [facility] Urgent Care at [facility])  C-SSRS RISK CATEGORY No Risk No Risk No Risk       Collaboration of Care:   Patient/Guardian was advised Release of Information must be obtained prior to any record release in order to collaborate their care with an outside provider. Patient/Guardian was advised if they have not already done so to contact the registration department to sign all necessary forms in order for [place] to release information regarding their care.   Consent: Patient/Guardian gives verbal consent for treatment and assignment of benefits for services provided during this visit. Patient/Guardian expressed understanding and agreed to proceed.   Plan: Provided Supportive/Talk therapy.  He is putting a lot of stress on himself to provide for his family and only seeing negatives.  He was able to list things he is proud of himself for and he will work on expanding this list for next time.  He also tolerated starting the Zoloft well without any side effects so we will increase it at this time.  Prior to leaving the appointment he confirmed he was in a stable and safe mindset.  He reports no SI, HI, or AVH.   MDD, Recurrent, Moderate  GAD  PTSD: -Increase Zoloft to 50 mg daily for depression and anxiety.  30 tablets with 1 refill.    Lauro Franklin, MD 04/18/2023

## 2023-04-25 ENCOUNTER — Other Ambulatory Visit: Payer: Self-pay

## 2023-05-02 ENCOUNTER — Ambulatory Visit (HOSPITAL_COMMUNITY): Payer: No Payment, Other | Admitting: Student in an Organized Health Care Education/Training Program

## 2023-05-02 NOTE — Progress Notes (Unsigned)
  [facility] [address] [place] [address] Dept: [phone] Dept Fax: [phone]  Psychotherapy Progress Note  Patient ID: Paul [name], male  DOB: February 06, 2001, 22 y.o.  MRN: [id number]  05/02/2023 Start time: 9:*** AM End time: 9:*** AM  Method of Visit: Face-to-Face  Present: patient  Current Concerns:  He reports ***    Current Symptoms: Anxiety, Depressed Mood, and Family Stress, financial stress *** Psychiatric Specialty Exam: General Appearance: Casual and Fairly Groomed  Eye Contact:  Good  Speech:  Clear and Coherent and Normal Rate  Volume:  Normal  Mood:  Anxious  Affect:  Congruent  Thought Process:  Coherent and Goal Directed  Orientation:  Full (Time, Place, and Person)  Thought Content:  WDL and Logical  Suicidal Thoughts:  No  Homicidal Thoughts:  No  Memory:  Immediate;   Good Recent;   Good  Judgement:  Good  Insight:  Good  Psychomotor Activity:  Normal  Concentration:  Concentration: Good and Attention Span: Good  Recall:  Good  Fund of Knowledge:Good  Language: Good  Akathisia:  Negative  Handed:  Right  AIMS (if indicated):  not done  Assets:  Communication Skills Desire for Improvement Housing Physical Health Resilience  ADL's:  Intact  Cognition: WNL  Sleep:  Fair     Diagnosis: MDD, Recurrent, Moderate  GAD  PTSD  Anticipated Frequency of Visits: every 2-3 weeks Anticipated Length of Treatment Episode: 16  Short Term Goals/Goals for Treatment Session: reflect on strengths Progress Towards Goals: Progressing  Treatment Intervention: Insight-oriented therapy and Supportive therapy  Medical Necessity: Assisted patient to achieve or maintain maximum functional capacity  Assessment Tools:    01/27/2023   11:51 AM  Depression screen PHQ 2/9  Decreased Interest 1  Down, Depressed, Hopeless 1  PHQ - 2 Score 2  Altered sleeping 2  Tired, decreased energy 2   Change in appetite 2  Feeling bad or failure about yourself  3  Trouble concentrating 2  Moving slowly or fidgety/restless 3  Suicidal thoughts 0  PHQ-9 Score 16  Difficult doing work/chores Very difficult   Failed to redirect to the Timeline version of the REVFS SmartLink. Flowsheet Row ED from 03/27/2023 in [facility] Emergency Department at [facility] ED from 11/06/2022 in [facility] Urgent Care at [facility]) ED from 10/29/2022 in [facility] Care at [facility])  C-SSRS RISK CATEGORY No Risk No Risk No Risk       Collaboration of Care:   Patient/Guardian was advised Release of Information must be obtained prior to any record release in order to collaborate their care with an outside provider. Patient/Guardian was advised if they have not already done so to contact the registration department to sign all necessary forms in order for [place] to release information regarding their care.   Consent: Patient/Guardian gives verbal consent for treatment and assignment of benefits for services provided during this visit. Patient/Guardian expressed understanding and agreed to proceed.   Plan: Provided Supportive/Talk therapy.  ***   MDD, Recurrent, Moderate  GAD  PTSD: -Increase Zoloft to 50 mg daily for depression and anxiety.  30 tablets with 1 refill.    Lauro Franklin, MD 05/02/2023

## 2023-05-30 ENCOUNTER — Ambulatory Visit (HOSPITAL_COMMUNITY): Payer: No Payment, Other | Admitting: Student in an Organized Health Care Education/Training Program

## 2023-06-20 ENCOUNTER — Ambulatory Visit (HOSPITAL_COMMUNITY): Payer: No Payment, Other | Admitting: Student in an Organized Health Care Education/Training Program

## 2023-06-20 NOTE — Progress Notes (Unsigned)
  [facility] [address] [place] [address] Dept: [phone] Dept Fax: [phone]  Psychotherapy Progress Note  Patient ID: Paul [name], male  DOB: 25-Jan-2001, 22 y.o.  MRN: [id number]  06/20/2023 Start time: 9:*** AM End time: 9:*** AM  Method of Visit: Face-to-Face  Present: patient  Current Concerns:  He reports ***    Current Symptoms: Anxiety, Depressed Mood, and Family Stress, financial stress *** Psychiatric Specialty Exam: General Appearance: Casual and Fairly Groomed  Eye Contact:  Good  Speech:  Clear and Coherent and Normal Rate  Volume:  Normal  Mood:  Anxious  Affect:  Congruent  Thought Process:  Coherent and Goal Directed  Orientation:  Full (Time, Place, and Person)  Thought Content:  WDL and Logical  Suicidal Thoughts:  No  Homicidal Thoughts:  No  Memory:  Immediate;   Good Recent;   Good  Judgement:  Good  Insight:  Good  Psychomotor Activity:  Normal  Concentration:  Concentration: Good and Attention Span: Good  Recall:  Good  Fund of Knowledge:Good  Language: Good  Akathisia:  Negative  Handed:  Right  AIMS (if indicated):  not done  Assets:  Communication Skills Desire for Improvement Housing Physical Health Resilience  ADL's:  Intact  Cognition: WNL  Sleep:  Fair     Diagnosis: MDD, Recurrent, Moderate  GAD  PTSD  Anticipated Frequency of Visits: every 2-3 weeks Anticipated Length of Treatment Episode: 16  Short Term Goals/Goals for Treatment Session: reflect on strengths *** Progress Towards Goals: Progressing  Treatment Intervention: Insight-oriented therapy and Supportive therapy  Medical Necessity: Assisted patient to achieve or maintain maximum functional capacity  Assessment Tools:    01/27/2023   11:51 AM  Depression screen PHQ 2/9  Decreased Interest 1  Down, Depressed, Hopeless 1  PHQ - 2 Score 2  Altered sleeping 2  Tired, decreased energy 2   Change in appetite 2  Feeling bad or failure about yourself  3  Trouble concentrating 2  Moving slowly or fidgety/restless 3  Suicidal thoughts 0  PHQ-9 Score 16  Difficult doing work/chores Very difficult   Failed to redirect to the Timeline version of the REVFS SmartLink. Flowsheet Row ED from 03/27/2023 in [facility] Emergency Department at [facility] ED from 11/06/2022 in [facility] Urgent Care at [facility]) ED from 10/29/2022 in [facility] Urgent Care at [facility])  C-SSRS RISK CATEGORY No Risk No Risk No Risk       Collaboration of Care:   Patient/Guardian was advised Release of Information must be obtained prior to any record release in order to collaborate their care with an outside provider. Patient/Guardian was advised if they have not already done so to contact the registration department to sign all necessary forms in order for [place] to release information regarding their care.   Consent: Patient/Guardian gives verbal consent for treatment and assignment of benefits for services provided during this visit. Patient/Guardian expressed understanding and agreed to proceed.   Plan: Provided Supportive/Talk therapy.  ***   MDD, Recurrent, Moderate  GAD  PTSD: -Continue Zoloft to 50 mg daily for depression and anxiety.  30 tablets with 1 refill.    Lauro Franklin, MD 06/20/2023

## 2023-07-29 ENCOUNTER — Other Ambulatory Visit: Payer: Self-pay

## 2023-07-29 ENCOUNTER — Emergency Department (HOSPITAL_COMMUNITY): Payer: Self-pay

## 2023-07-29 ENCOUNTER — Encounter (HOSPITAL_COMMUNITY): Payer: Self-pay

## 2023-07-29 ENCOUNTER — Emergency Department (HOSPITAL_COMMUNITY)
Admission: EM | Admit: 2023-07-29 | Discharge: 2023-07-29 | Disposition: A | Discharge status: Home / Self Care | Payer: Worker's Compensation | Attending: Emergency Medicine | Admitting: Emergency Medicine

## 2023-07-29 ENCOUNTER — Ambulatory Visit
Admission: EM | Admit: 2023-07-29 | Discharge: 2023-07-29 | Disposition: A | Discharge status: Other Acute Inpatient Hospital | Payer: Self-pay

## 2023-07-29 DIAGNOSIS — S0990XA Unspecified injury of head, initial encounter: Secondary | ICD-10-CM

## 2023-07-29 DIAGNOSIS — S060X0A Concussion without loss of consciousness, initial encounter: Secondary | ICD-10-CM | POA: Diagnosis not present

## 2023-07-29 DIAGNOSIS — S0001XA Abrasion of scalp, initial encounter: Secondary | ICD-10-CM | POA: Insufficient documentation

## 2023-07-29 DIAGNOSIS — W228XXA Striking against or struck by other objects, initial encounter: Secondary | ICD-10-CM | POA: Insufficient documentation

## 2023-07-29 DIAGNOSIS — L0501 Pilonidal cyst with abscess: Secondary | ICD-10-CM | POA: Insufficient documentation

## 2023-07-29 DIAGNOSIS — Y990 Civilian activity done for income or pay: Secondary | ICD-10-CM | POA: Insufficient documentation

## 2023-07-29 NOTE — ED Triage Notes (Signed)
Pt arrived reporting yesterday he hit his head at work, left side. No LOC. Patient reports intermittent dizziness. No nausea vomiting other symptoms.  Pt also endorses he has suicidal ideations. Denies H/I. Reports he doesn't feel that way at this exact moment and would prefer to f/u with [facility].

## 2023-07-29 NOTE — Discharge Instructions (Signed)
You have concussion   Take tylenol, motrin for headache   Rest for 2 days   See your doctor for follow up   Return to ER if you have worse headache, vomiting, weakness

## 2023-07-29 NOTE — ED Provider Notes (Signed)
Patient presented to urgent care after head injury yesterday.  He reports that today he states he is having difficulty with focus and has had more headache.  Recommended further evaluation in the emergency department.  Friend with him will transport POV.   Tomi Bamberger, PA-C 07/29/23 [phone]

## 2023-07-29 NOTE — ED Provider Notes (Signed)
Paul [name] EMERGENCY DEPARTMENT AT [facility] Provider Note   CSN: [id number] Arrival date & time: 07/29/23  1527     History  Chief Complaint  Patient presents with   Head Injury    Paul [name] is a 22 y.o. male otherwise healthy here presenting with head injury.  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  He states that he lifted his head up and accidentally hit a beam.  He states that he noticed some bleeding in his scalp.  Since yesterday he noticed dizziness and trouble focusing.  Denies any vomiting or weakness or numbness.  Patient went to urgent care was sent here for higher level of care.  Patient denies any previous history of head injuries.  The history is provided by the patient.       Home Medications Prior to Admission medications   Medication Sig Start Date End Date Taking? Authorizing Provider  acetaminophen (TYLENOL) 325 MG tablet Take 325 mg by mouth every 6 (six) hours as needed for mild pain or headache.    [provider]  albuterol (VENTOLIN HFA) 108 (90 Base) MCG/ACT inhaler Inhale 1-2 puffs into the lungs every 6 (six) hours as needed for wheezing or shortness of breath. Patient not taking: Reported on 03/27/2023 10/29/22   Gustavus Bryant, FNP  Cyanocobalamin (VITAMIN B-12 PO) Take 1 tablet by mouth 2 (two) times a week. Tuesdays and Thursdays    [provider]  ibuprofen (ADVIL) 800 MG tablet Take 1 tablet (800 mg total) by mouth 3 (three) times daily. 03/27/23   Horton, Clabe Seal, DO  naproxen sodium (ALEVE) 220 MG tablet Take 220 mg by mouth daily as needed (pain or headache).    [provider]  sertraline (ZOLOFT) 50 MG tablet Take 1 tablet (50 mg total) by mouth daily. 04/18/23   Lauro Franklin, MD  UNABLE TO FIND Med Name: Vitamin B12 Injections.    [provider]      Allergies    Patient has no known allergies.    Review of Systems   Review of Systems  Skin:  Positive  for wound.  Neurological:  Positive for dizziness and headaches.  All other systems reviewed and are negative.   Physical Exam Updated Vital Signs BP 132/78 (BP Location: Left Arm)   Pulse 88   Temp 97.9 F (36.6 C) (Oral)   Resp 16   Ht 5\' 11"  (1.803 m)   Wt 95 kg   SpO2 100%   BMI 29.21 kg/m  Physical Exam Vitals and nursing note reviewed.  Constitutional:      Appearance: Normal appearance.  HENT:     Head:     Comments: Small abrasion on the superior frontal scalp.    Nose: Nose normal.     Mouth/Throat:     Mouth: Mucous membranes are moist.  Eyes:     Extraocular Movements: Extraocular movements intact.     Pupils: Pupils are equal, round, and reactive to light.  Cardiovascular:     Rate and Rhythm: Normal rate and regular rhythm.     Pulses: Normal pulses.     Heart sounds: Normal heart sounds.  Pulmonary:     Effort: Pulmonary effort is normal.     Breath sounds: Normal breath sounds.  Abdominal:     General: Abdomen is flat.     Palpations: Abdomen is soft.  Musculoskeletal:        General: Normal  range of motion.     Cervical back: Normal range of motion and neck supple.  Skin:    General: Skin is warm.     Capillary Refill: Capillary refill takes less than 2 seconds.  Neurological:     General: No focal deficit present.     Mental Status: He is alert and oriented to person, place, and time.     Comments: Cranial nerves II to XII intact.  Patient has normal strength and sensation bilateral arms and legs.  Patient has normal gait.  Patient in particular has normal tandem gait and negative Romberg sign  Psychiatric:        Mood and Affect: Mood normal.        Behavior: Behavior normal.     ED Results / Procedures / Treatments   Labs (all labs ordered are listed, but only abnormal results are displayed) Labs Reviewed - No data to display  EKG None  Radiology No results found.  Procedures Procedures    Medications Ordered in ED Medications  - No data to display  ED Course/ Medical Decision Making/ A&P                                 Medical Decision Making Paul [name] is a 22 y.o. male here with head injury.  Patient has small frontal superior scalp abrasion.  Patient has unremarkable neuroexam.  I think likely concussion.  Will get CT head to rule out a bleed.  6:38 PM CT head showed no bleed.  Likely has concussion.  Stable for discharge  Problems Addressed: Concussion without loss of consciousness, initial encounter: acute illness or injury  Amount and/or Complexity of Data Reviewed Radiology: ordered and independent interpretation performed. Decision-making details documented in ED Course.    Final Clinical Impression(s) / ED Diagnoses Final diagnoses:  None    Rx / DC Orders ED Discharge Orders     None         Charlynne Pander, MD 07/29/23 Paul [name]

## 2023-07-29 NOTE — ED Triage Notes (Signed)
Here with Girl fiend. "Yesterday I was in a crawl space working, at the time of the injury around 340ish, I just knew I bumped my head but no LOC, no bleeding (known at the time), no dizziness at the time or after, no nausea or vomiting. "I now today feel like I can't focus 100% and the frontal part of my head hurts". "My mom noticed some blood in my hair after getting home last night".

## 2023-07-29 NOTE — ED Notes (Signed)
Patient is being discharged from the Urgent Care and sent to the Emergency Department via POV . Per RM, patient is in need of higher level of care due to head injury. Patient is aware and verbalizes understanding of plan of care.  Vitals:   07/29/23 1456  BP: 117/71  Pulse: 83  Resp: 18  Temp: 98.4 F (36.9 C)  SpO2: 99%

## 2023-08-14 ENCOUNTER — Ambulatory Visit
Admission: RE | Admit: 2023-08-14 | Discharge: 2023-08-14 | Disposition: A | Discharge status: Home / Self Care | Payer: Self-pay | Source: Ambulatory Visit

## 2023-08-14 ENCOUNTER — Ambulatory Visit
Admission: RE | Admit: 2023-08-14 | Discharge: 2023-08-14 | Disposition: A | Discharge status: Home / Self Care | Payer: Self-pay | Source: Ambulatory Visit | Attending: Family Medicine | Admitting: Family Medicine

## 2023-08-14 ENCOUNTER — Other Ambulatory Visit: Payer: Self-pay

## 2023-08-14 ENCOUNTER — Encounter: Payer: Self-pay | Admitting: *Deleted

## 2023-08-14 DIAGNOSIS — H5789 Other specified disorders of eye and adnexa: Secondary | ICD-10-CM

## 2023-08-14 MED ORDER — OLOPATADINE HCL 0.2 % OP SOLN
1.0000 [drp] | Freq: Every day | OPHTHALMIC | 0 refills | Status: AC
  Filled 2023-08-14: qty 2.5, 25d supply, fill #0

## 2023-08-14 NOTE — ED Provider Notes (Signed)
[facility]   [id number] 08/14/23 Arrival Time: 1350  ASSESSMENT & PLAN:  1. Irritation of right eye    Declines fluorescein exam of eye. Will treat for likely viral/allergic eye irritation. Does not wear contact lenses. Trial of below.  Discharge Medication List as of 08/14/2023  3:17 PM     START taking these medications   Details  Olopatadine HCl 0.2 % SOLN Apply 1 drop to eye daily., Starting Thu 08/14/2023, Normal         Follow-up Information     [facility]).   Specialty: Urgent Care Why: If worsening or failing to improve as anticipated. Contact information: [address] [place] [address] [phone]                Reviewed expectations re: course of current medical issues. Questions answered. Outlined signs and symptoms indicating need for more acute intervention. Understanding verbalized. After Visit Summary given.   SUBJECTIVE: History from: Patient. Paul [name] is a 22 y.o. male. Pt reports eye irritation; several days. States "feels like sandpaper at times". Watery drainage. Now right eye mostly  bothering him. 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 Otherwise well. Denies visual changes/photophobia. Does not wear contact lenses.  OBJECTIVE:  Vitals:   08/14/23 1434  BP: 126/79  Pulse: 77  Resp: 18  Temp: 98.4 F (36.9 C)  TempSrc: Oral  SpO2: 98%    General appearance: alert; no distress Eyes: PERRLA; EOMI; conjunctiva on R with slight injection and watery drainage; without limbal flush; without gross corneal abnormalities HENT: [place]; AT; without nasal congestion Neck: supple  Lungs: speaks full sentences without difficulty; unlabored Extremities: no edema Skin: warm and dry Neurologic: normal gait Psychological: alert and cooperative; normal mood and affect  No Known Allergies  Past Medical History:   Diagnosis Date   Anal fissure    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    Social Drivers of Corporate investment banker Strain: Not on file  Food Insecurity: Not on file  Transportation Needs: Not on file  Physical Activity: Not on file  Stress: Not on file  Social Connections: Not on file  Intimate Partner Violence: Not on file   Family History  Problem Relation Age of Onset   Depression Maternal Grandmother    Varicose Veins Maternal Grandmother    Hearing loss Maternal Grandfather    Hyperlipidemia Maternal Grandfather    Hypertension Maternal Grandfather  Cancer Paternal Grandmother        Bladder   Hearing loss Paternal Grandmother    Hypertension Paternal Grandfather    Cancer Paternal Aunt        Thyroid   Hearing loss Other        Deaf   Hearing loss Other        Deaf   Past Surgical History:  Procedure Laterality Date   ORIF RADIAL FRACTURE Left 09/22/2020   Procedure: OPEN REDUCTION INTERNAL FIXATION (ORIF) LEFT  RADIAL FRACTURE;  Surgeon: Knute Neu, MD;  Location: [facility];  Service: Plastics;  Laterality: Left;  regional block   PILONIDAL CYST EXCISION N/A 07/09/2017   Procedure: EXCISION PILONIDAL  CYST PEDIATRIC EXTENSIVE;  Surgeon: Kandice Hams, MD;  Location: [facility] OR;  Service: Pediatrics;  Laterality: N/AMardella Layman, MD 08/14/23 [phone]

## 2023-08-14 NOTE — ED Triage Notes (Signed)
Pt reports eye irritation on Tuesday. States "feels like sandpaper". Eyes were watery on Tuesday. Now right eye still bothering him. 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

## 2023-08-25 ENCOUNTER — Other Ambulatory Visit: Payer: Self-pay

## 2023-12-20 ENCOUNTER — Ambulatory Visit (HOSPITAL_COMMUNITY): Payer: Self-pay

## 2024-02-14 ENCOUNTER — Other Ambulatory Visit: Payer: Self-pay

## 2024-02-14 ENCOUNTER — Emergency Department (HOSPITAL_COMMUNITY)
Admission: EM | Admit: 2024-02-14 | Discharge: 2024-02-15 | Disposition: A | Discharge status: Home / Self Care | Attending: Emergency Medicine | Admitting: Emergency Medicine

## 2024-02-14 DIAGNOSIS — L500 Allergic urticaria: Secondary | ICD-10-CM | POA: Diagnosis not present

## 2024-02-14 DIAGNOSIS — L509 Urticaria, unspecified: Secondary | ICD-10-CM | POA: Diagnosis present

## 2024-02-14 DIAGNOSIS — T7840XA Allergy, unspecified, initial encounter: Secondary | ICD-10-CM

## 2024-02-14 MED ORDER — DIPHENHYDRAMINE HCL 50 MG/ML IJ SOLN
50.0000 mg | Freq: Once | INTRAMUSCULAR | Status: AC
  Administered 2024-02-14: 50 mg via INTRAVENOUS
  Filled 2024-02-14: qty 1

## 2024-02-14 MED ORDER — METHYLPREDNISOLONE SODIUM SUCC 125 MG IJ SOLR
125.0000 mg | Freq: Once | INTRAMUSCULAR | Status: AC
  Administered 2024-02-14: 125 mg via INTRAVENOUS
  Filled 2024-02-14: qty 2

## 2024-02-14 MED ORDER — FAMOTIDINE IN NACL 20-0.9 MG/50ML-% IV SOLN
20.0000 mg | Freq: Once | INTRAVENOUS | Status: AC
  Administered 2024-02-14: 20 mg via INTRAVENOUS
  Filled 2024-02-14: qty 50

## 2024-02-14 MED ORDER — PREDNISONE 10 MG PO TABS: 40.0000 mg | ORAL_TABLET | Freq: Every day | ORAL | 0 refills | Status: AC

## 2024-02-14 MED ORDER — EPINEPHRINE 0.3 MG/0.3ML IJ SOAJ
0.3000 mg | INTRAMUSCULAR | 0 refills | Status: AC | PRN
Start: 2024-02-14 — End: ?

## 2024-02-14 NOTE — ED Triage Notes (Signed)
 Patient brought in by EMS for allergic reaction related to a bee sting. Patient was in his garage and was stung by several yellow jackets that were in his shoe. Patient was mostly stung in the left foot. Presents with hives and redness all over this body. Swelling to the left foot. No airway compromise. Denies swelling to the tongue or trouble swallowing. Patient vomited in route.  EMS gave 50mg  PO benadryl  and 0.3Epi IM in right arm.

## 2024-02-14 NOTE — Discharge Instructions (Signed)
 Return for any problem.  ?

## 2024-02-14 NOTE — ED Provider Notes (Signed)
 [place] EMERGENCY DEPARTMENT AT [facility] Provider Note   CSN: [id number] Arrival date & time: 02/14/24  2035     Patient presents with: Allergic Reaction   Paul [name] is a 23 y.o. male.   23 year old male presents for evaluation.  Patient was stung several times in the left ankle by yellowjacket.  Within minutes of the sting he developed diffuse urticarial rash.  He denies difficulty breathing or trouble swallowing.  EMS gave epi IM during transport.  EMS also gave 50 mg p.o. Benadryl .  Patient subsequently vomited up the p.o. Benadryl .  The history is provided by the patient and medical records.       Prior to Admission medications   Medication Sig Start Date End Date Taking? Authorizing Provider  Cyanocobalamin (VITAMIN B-12 PO) Take 1 tablet by mouth 2 (two) times a week. Tuesdays and Thursdays   Yes [provider]  acetaminophen  (TYLENOL ) 325 MG tablet Take 325 mg by mouth every 6 (six) hours as needed for mild pain or headache. Patient not taking: Reported on 08/14/2023    [provider]  albuterol  (VENTOLIN  HFA) 108 (90 Base) MCG/ACT inhaler Inhale 1-2 puffs into the lungs every 6 (six) hours as needed for wheezing or shortness of breath. Patient not taking: Reported on 03/27/2023 10/29/22   Hazen Darryle BRAVO, FNP  ibuprofen  (ADVIL ) 800 MG tablet Take 1 tablet (800 mg total) by mouth 3 (three) times daily. Patient not taking: Reported on 08/14/2023 03/27/23   Horton, Roxie HERO, DO  naproxen  sodium (ALEVE ) 220 MG tablet Take 220 mg by mouth daily as needed (pain or headache). Patient not taking: Reported on 08/14/2023    [provider]  Olopatadine  HCl 0.2 % SOLN Apply 1 drop to eye daily. 08/14/23   Rolinda Rogue, MD  sertraline  (ZOLOFT ) 50 MG tablet Take 1 tablet (50 mg total) by mouth daily. Patient not taking: Reported on 08/14/2023 04/18/23   Raliegh Marsa RAMAN, DO  UNABLE TO FIND Med Name: Vitamin B12 Injections.    [provider]    Allergies: Bee venom    Review of Systems  All other systems reviewed and are negative.   Updated Vital Signs BP 121/78   Pulse 70   Temp 97.8 F (36.6 C) (Oral)   Resp 15   Ht 5' 11 (1.803 m)   Wt 95.3 kg   SpO2 96%   BMI 29.29 kg/m   Physical Exam Vitals and nursing note reviewed.  Constitutional:      General: He is not in acute distress.    Appearance: Normal appearance. He is well-developed.  HENT:     Head: Normocephalic and atraumatic.   Eyes:     Conjunctiva/sclera: Conjunctivae normal.     Pupils: Pupils are equal, round, and reactive to light.    Cardiovascular:     Rate and Rhythm: Normal rate and regular rhythm.     Heart sounds: Normal heart sounds.  Pulmonary:     Effort: Pulmonary effort is normal. No respiratory distress.     Breath sounds: Normal breath sounds.  Abdominal:     General: There is no distension.     Palpations: Abdomen is soft.     Tenderness: There is no abdominal tenderness.   Musculoskeletal:        General: No deformity. Normal range of motion.     Cervical back: Normal range of motion and neck supple.   Skin:    General: Skin is warm  and dry.     Findings: Rash present.     Comments: Diffuse urticarial rash present   Neurological:     General: No focal deficit present.     Mental Status: He is alert and oriented to person, place, and time.     (all labs ordered are listed, but only abnormal results are displayed) Labs Reviewed - No data to display  EKG: EKG Interpretation Date/Time:  Saturday February 14 2024 20:44:48 EDT Ventricular Rate:  75 PR Interval:  149 QRS Duration:  109 QT Interval:  363 QTC Calculation: 406 R Axis:   14  Text Interpretation: Sinus rhythm RSR' in V1 or V2, right VCD or RVH ST elev, probable normal early repol pattern Confirmed by Laurice Coy [phone]) on 02/14/2024 8:49:55 PM  Radiology: No results found.   Procedures   Medications Ordered in the ED   famotidine  (PEPCID ) IVPB 20 mg premix (has no administration in time range)  diphenhydrAMINE  (BENADRYL ) injection 50 mg (has no administration in time range)  methylPREDNISolone  sodium succinate (SOLU-MEDROL ) 125 mg/2 mL injection 125 mg (has no administration in time range)                                    Medical Decision Making Risk Prescription drug management.    Medical Screen Complete  This patient presented to the ED with complaint of insect sting, urticarial rash.  This complaint involves an extensive number of treatment options. The initial differential diagnosis includes, but is not limited to, allergic reaction  This presentation is: Acute, Self-Limited, Previously Undiagnosed, Uncertain Prognosis, Complicated, Systemic Symptoms, and Threat to Life/Bodily Function   Patient presents after yellowjacket sting.  Patient was acute allergic reaction.  He has diffuse urticaria.  Epi given by EMS.  Solu-Medrol , Benadryl , Pepcid  given IV on arrival to the ED.  Shortly after medication administration patient's rash is resolved.  Patient feels significantly improved.  After period of observation the patient was appropriate for discharge.  Importance of close follow-up stressed.  Strict return precautions given and understood.  Additional history obtained:  External records from outside sources obtained and reviewed including prior ED visits and prior Inpatient records.    Problem List / ED Course:  Allergic reaction, insect sting  Disposition:  After consideration of the diagnostic results and the patients response to treatment, I feel that the patent would benefit from close outpatient follow-up.       Final diagnoses:  Allergic reaction, initial encounter    ED Discharge Orders          Ordered    EPINEPHrine 0.3 mg/0.3 mL IJ SOAJ injection  As needed        02/14/24 2244    predniSONE  (DELTASONE ) 10 MG tablet  Daily        02/14/24 2244                Laurice Coy BROCKS, MD 02/14/24 2254
